# Patient Record
Sex: Female | Born: 1970 | Race: White | Hispanic: No | Marital: Married | Smoking: Former smoker
Health system: Southern US, Community
[De-identification: ages and names within clinical notes are randomized; demographics above are authoritative.]

## PROBLEM LIST (undated history)

## (undated) DIAGNOSIS — F419 Anxiety disorder, unspecified: Secondary | ICD-10-CM

## (undated) DIAGNOSIS — N809 Endometriosis, unspecified: Secondary | ICD-10-CM

## (undated) DIAGNOSIS — F32A Depression, unspecified: Secondary | ICD-10-CM

## (undated) HISTORY — DX: Endometriosis, unspecified: N80.9

## (undated) HISTORY — DX: Anxiety disorder, unspecified: F41.9

## (undated) HISTORY — DX: Depression, unspecified: F32.A

---

## 1997-11-21 ENCOUNTER — Encounter: Admission: RE | Admit: 1997-11-21 | Discharge: 1997-11-21 | Payer: Self-pay | Admitting: Family Medicine

## 1998-03-09 ENCOUNTER — Encounter: Admission: RE | Admit: 1998-03-09 | Discharge: 1998-03-09 | Payer: Self-pay | Admitting: Family Medicine

## 1998-11-29 ENCOUNTER — Other Ambulatory Visit: Admission: RE | Admit: 1998-11-29 | Discharge: 1998-11-29 | Payer: Self-pay | Admitting: Obstetrics and Gynecology

## 1999-01-11 ENCOUNTER — Encounter: Payer: Self-pay | Admitting: *Deleted

## 1999-01-11 ENCOUNTER — Inpatient Hospital Stay (HOSPITAL_COMMUNITY): Admission: AD | Admit: 1999-01-11 | Discharge: 1999-01-14 | Payer: Self-pay | Admitting: Obstetrics and Gynecology

## 1999-01-13 ENCOUNTER — Encounter: Payer: Self-pay | Admitting: Obstetrics and Gynecology

## 1999-02-14 ENCOUNTER — Ambulatory Visit (HOSPITAL_COMMUNITY): Admission: RE | Admit: 1999-02-14 | Discharge: 1999-02-14 | Payer: Self-pay | Admitting: Obstetrics and Gynecology

## 1999-02-14 ENCOUNTER — Encounter: Payer: Self-pay | Admitting: Obstetrics and Gynecology

## 1999-07-10 HISTORY — PX: LAPAROTOMY: SHX154

## 1999-07-24 ENCOUNTER — Inpatient Hospital Stay (HOSPITAL_COMMUNITY): Admission: RE | Admit: 1999-07-24 | Discharge: 1999-07-26 | Payer: Self-pay | Admitting: Obstetrics and Gynecology

## 1999-08-24 ENCOUNTER — Inpatient Hospital Stay (HOSPITAL_COMMUNITY): Admission: EM | Admit: 1999-08-24 | Discharge: 1999-08-24 | Payer: Self-pay | Admitting: Obstetrics and Gynecology

## 1999-09-25 ENCOUNTER — Encounter (HOSPITAL_COMMUNITY): Admission: RE | Admit: 1999-09-25 | Discharge: 1999-12-24 | Payer: Self-pay | Admitting: Obstetrics and Gynecology

## 1999-12-29 ENCOUNTER — Inpatient Hospital Stay (HOSPITAL_COMMUNITY): Admission: AD | Admit: 1999-12-29 | Discharge: 1999-12-29 | Payer: Self-pay | Admitting: Obstetrics and Gynecology

## 2000-06-25 ENCOUNTER — Encounter: Payer: Self-pay | Admitting: Obstetrics and Gynecology

## 2000-06-25 ENCOUNTER — Ambulatory Visit (HOSPITAL_COMMUNITY): Admission: RE | Admit: 2000-06-25 | Discharge: 2000-06-25 | Payer: Self-pay | Admitting: Obstetrics and Gynecology

## 2003-05-13 ENCOUNTER — Other Ambulatory Visit: Admission: RE | Admit: 2003-05-13 | Discharge: 2003-05-13 | Payer: Self-pay | Admitting: Obstetrics and Gynecology

## 2004-06-12 ENCOUNTER — Other Ambulatory Visit: Admission: RE | Admit: 2004-06-12 | Discharge: 2004-06-12 | Payer: Self-pay | Admitting: Obstetrics and Gynecology

## 2014-10-03 ENCOUNTER — Other Ambulatory Visit: Payer: Self-pay | Admitting: Obstetrics and Gynecology

## 2014-10-03 DIAGNOSIS — N644 Mastodynia: Secondary | ICD-10-CM

## 2014-10-07 ENCOUNTER — Ambulatory Visit
Admission: RE | Admit: 2014-10-07 | Discharge: 2014-10-07 | Disposition: A | Discharge status: Home / Self Care | Payer: 59 | Source: Ambulatory Visit | Attending: Obstetrics and Gynecology | Admitting: Obstetrics and Gynecology

## 2014-10-07 DIAGNOSIS — N644 Mastodynia: Secondary | ICD-10-CM

## 2020-02-18 DIAGNOSIS — F419 Anxiety disorder, unspecified: Secondary | ICD-10-CM | POA: Diagnosis not present

## 2020-03-21 DIAGNOSIS — Z20822 Contact with and (suspected) exposure to covid-19: Secondary | ICD-10-CM | POA: Diagnosis not present

## 2020-03-21 DIAGNOSIS — Z03818 Encounter for observation for suspected exposure to other biological agents ruled out: Secondary | ICD-10-CM | POA: Diagnosis not present

## 2020-04-13 DIAGNOSIS — R87610 Atypical squamous cells of undetermined significance on cytologic smear of cervix (ASC-US): Secondary | ICD-10-CM | POA: Diagnosis not present

## 2020-08-21 DIAGNOSIS — Z0000 Encounter for general adult medical examination without abnormal findings: Secondary | ICD-10-CM | POA: Diagnosis not present

## 2020-08-28 DIAGNOSIS — M545 Low back pain, unspecified: Secondary | ICD-10-CM | POA: Diagnosis not present

## 2020-08-28 DIAGNOSIS — R87610 Atypical squamous cells of undetermined significance on cytologic smear of cervix (ASC-US): Secondary | ICD-10-CM | POA: Diagnosis not present

## 2020-08-28 DIAGNOSIS — Z0000 Encounter for general adult medical examination without abnormal findings: Secondary | ICD-10-CM | POA: Diagnosis not present

## 2020-08-28 DIAGNOSIS — Z23 Encounter for immunization: Secondary | ICD-10-CM | POA: Diagnosis not present

## 2020-08-28 DIAGNOSIS — Z1212 Encounter for screening for malignant neoplasm of rectum: Secondary | ICD-10-CM | POA: Diagnosis not present

## 2020-12-13 DIAGNOSIS — Z6829 Body mass index (BMI) 29.0-29.9, adult: Secondary | ICD-10-CM | POA: Diagnosis not present

## 2020-12-13 DIAGNOSIS — Z1231 Encounter for screening mammogram for malignant neoplasm of breast: Secondary | ICD-10-CM | POA: Diagnosis not present

## 2020-12-13 DIAGNOSIS — N951 Menopausal and female climacteric states: Secondary | ICD-10-CM | POA: Diagnosis not present

## 2020-12-13 DIAGNOSIS — Z01419 Encounter for gynecological examination (general) (routine) without abnormal findings: Secondary | ICD-10-CM | POA: Diagnosis not present

## 2020-12-18 ENCOUNTER — Other Ambulatory Visit: Payer: Self-pay | Admitting: Obstetrics and Gynecology

## 2020-12-18 ENCOUNTER — Encounter: Payer: Self-pay | Admitting: Internal Medicine

## 2020-12-18 DIAGNOSIS — R928 Other abnormal and inconclusive findings on diagnostic imaging of breast: Secondary | ICD-10-CM

## 2020-12-21 ENCOUNTER — Other Ambulatory Visit: Payer: Self-pay

## 2020-12-21 ENCOUNTER — Ambulatory Visit
Admission: RE | Admit: 2020-12-21 | Discharge: 2020-12-21 | Disposition: A | Discharge status: Home / Self Care | Payer: BC Managed Care – PPO | Source: Ambulatory Visit | Attending: Obstetrics and Gynecology | Admitting: Obstetrics and Gynecology

## 2020-12-21 DIAGNOSIS — R928 Other abnormal and inconclusive findings on diagnostic imaging of breast: Secondary | ICD-10-CM

## 2020-12-21 DIAGNOSIS — R922 Inconclusive mammogram: Secondary | ICD-10-CM | POA: Diagnosis not present

## 2020-12-22 ENCOUNTER — Other Ambulatory Visit: Payer: Self-pay

## 2020-12-22 ENCOUNTER — Ambulatory Visit (AMBULATORY_SURGERY_CENTER): Payer: BC Managed Care – PPO

## 2020-12-22 VITALS — Ht 67.0 in | Wt 181.0 lb

## 2020-12-22 DIAGNOSIS — Z1211 Encounter for screening for malignant neoplasm of colon: Secondary | ICD-10-CM

## 2020-12-22 NOTE — Progress Notes (Signed)

## 2020-12-25 ENCOUNTER — Encounter: Payer: Self-pay | Admitting: Internal Medicine

## 2021-01-04 ENCOUNTER — Encounter: Payer: Self-pay | Admitting: Internal Medicine

## 2021-01-04 ENCOUNTER — Other Ambulatory Visit: Payer: Self-pay

## 2021-01-04 ENCOUNTER — Ambulatory Visit (AMBULATORY_SURGERY_CENTER): Payer: BC Managed Care – PPO | Admitting: Internal Medicine

## 2021-01-04 VITALS — BP 110/92 | HR 53 | Temp 97.3°F | Resp 10 | Ht 67.0 in | Wt 181.0 lb

## 2021-01-04 DIAGNOSIS — R195 Other fecal abnormalities: Secondary | ICD-10-CM

## 2021-01-04 DIAGNOSIS — K649 Unspecified hemorrhoids: Secondary | ICD-10-CM

## 2021-01-04 DIAGNOSIS — Z1211 Encounter for screening for malignant neoplasm of colon: Secondary | ICD-10-CM | POA: Diagnosis not present

## 2021-01-04 MED ORDER — SODIUM CHLORIDE 0.9 % IV SOLN: 500.0000 mL | Freq: Once | INTRAVENOUS | Status: DC

## 2021-01-04 NOTE — Progress Notes (Signed)
No problems noted in the recovery room. maw 

## 2021-01-04 NOTE — Progress Notes (Signed)
Report given to PACU, vss 

## 2021-01-04 NOTE — Progress Notes (Signed)
Pt's states no medical or surgical changes since previsit or office visit. Vitals done by DT. 

## 2021-01-04 NOTE — Progress Notes (Signed)
GASTROENTEROLOGY PROCEDURE H&P NOTE   Primary Care Physician: Charlane Ferretti, DO    Reason for Procedure:  Colon cancer screening, positive Hemoccult test  Plan:    Colonoscopy  Patient is appropriate for endoscopic procedure(s) in the ambulatory ([facility]) setting.  The nature of the procedure, as well as the risks, benefits, and alternatives were carefully and thoroughly reviewed with the patient. Ample time for discussion and questions allowed. The patient understood, was satisfied, and agreed to proceed.     HPI: Bailey Cooley is a 50 y.o. female who presents for colonoscopy for colon cancer screening and positive hemoccult in 08/2020. Denies rectal bleeding, melena, diarrhea, constipation, or unintentional weight loss. Denies fam hx of colon cancer. No prior colonoscopy.  Past Medical History:  Diagnosis Date   Anxiety    Depression    Endometriosis     Past Surgical History:  Procedure Laterality Date   LAPAROTOMY  07/1999    Prior to Admission medications   Medication Sig Start Date End Date Taking? Authorizing Provider  escitalopram (LEXAPRO) 10 MG tablet Take 10 mg by mouth daily. 11/07/20  Yes [provider]  norgestimate-ethinyl estradiol (ORTHO-CYCLEN) 0.25-35 MG-MCG tablet Sprintec (28) 0.25 mg-35 mcg tablet  TAKE 1 TABLET BY MOUTH EVERY DAY   Yes [provider]  Diethylpropion HCl CR 75 MG TB24 diethylpropion ER 75 mg tablet,extended release  Take 1 tablet every day by oral route.    [provider]    Current Outpatient Medications  Medication Sig Dispense Refill   escitalopram (LEXAPRO) 10 MG tablet Take 10 mg by mouth daily.     norgestimate-ethinyl estradiol (ORTHO-CYCLEN) 0.25-35 MG-MCG tablet Sprintec (28) 0.25 mg-35 mcg tablet  TAKE 1 TABLET BY MOUTH EVERY DAY     Diethylpropion HCl CR 75 MG TB24 diethylpropion ER 75 mg tablet,extended release  Take 1 tablet every day by oral route.     Current Facility-Administered  Medications  Medication Dose Route Frequency Provider Last Rate Last Admin   0.9 %  sodium chloride infusion  500 mL Intravenous Once Imogene Burn, MD        Allergies as of 01/04/2021   (No Known Allergies)    Family History  Problem Relation Age of Onset   Esophageal cancer Maternal Aunt        unsure of age of onset   Colon cancer Neg Hx    Rectal cancer Neg Hx    Stomach cancer Neg Hx     Social History   Socioeconomic History   Marital status: Married    Spouse name: Not on file   Number of children: Not on file   Years of education: Not on file   Highest education level: Not on file  Occupational History   Not on file  Tobacco Use   Smoking status: Former    Types: Cigarettes    Quit date: 1997    Years since quitting: 25.8   Smokeless tobacco: Never  Vaping Use   Vaping Use: Never used  Substance and Sexual Activity   Alcohol use: Yes    Comment: socially   Drug use: Never   Sexual activity: Yes    Birth control/protection: Pill  Other Topics Concern   Not on file  Social History Narrative   Not on file   Social Determinants of Health   Financial Resource Strain: Not on file  Food Insecurity: Not on file  Transportation Needs: Not on file  Physical Activity: Not on  file  Stress: Not on file  Social Connections: Not on file  Intimate Partner Violence: Not on file    Physical Exam: Vital signs in last 24 hours: BP 122/61   Pulse 87   Temp (!) 97.3 F (36.3 C)   Ht 5\' 7"  (1.702 m)   Wt 181 lb (82.1 kg)   SpO2 99%   BMI 28.35 kg/m  GEN: NAD EYE: Sclerae anicteric ENT: MMM CV: Non-tachycardic Pulm: No increased work of breathing GI: Soft, NT/ND NEURO:  Alert & Oriented   , MD [facility]  01/04/2021 9:39 AM

## 2021-01-04 NOTE — Op Note (Signed)
[facility] Patient Name: Bailey Cooley Procedure Date: 01/04/2021 9:38 AM MRN: [id number] Endoscopist: Nicole Kindred "Bailey Cooley ,  Age: 50 Referring MD:  Date of Birth: Dec 09, 1970 Gender: Female Account #: [id number] Procedure:                Colonoscopy Indications:              Screening for colorectal malignant neoplasm,                            positive FOBT Medicines:                Monitored Anesthesia Care Procedure:                Pre-Anesthesia Assessment:                           - Prior to the procedure, a History and Physical                            was performed, and patient medications and                            allergies were reviewed. The patient's tolerance of                            previous anesthesia was also reviewed. The risks                            and benefits of the procedure and the sedation                            options and risks were discussed with the patient.                            All questions were answered, and informed consent                            was obtained. Prior Anticoagulants: The patient has                            taken no previous anticoagulant or antiplatelet                            agents. ASA Grade Assessment: II - A patient with                            mild systemic disease. After reviewing the risks                            and benefits, the patient was deemed in                            satisfactory condition to undergo the procedure.  After obtaining informed consent, the colonoscope                            was passed under direct vision. Throughout the                            procedure, the patient's blood pressure, pulse, and                            oxygen saturations were monitored continuously. The                            PCF-HQ190L Colonoscope was introduced through the                            anus and advanced to the the terminal ileum.  The                            colonoscopy was performed without difficulty. The                            patient tolerated the procedure well. Scope In: 9:48:38 AM Scope Out: 10:06:17 AM Scope Withdrawal Time: 0 hours 11 minutes 27 seconds  Total Procedure Duration: 0 hours 17 minutes 39 seconds  Findings:                 The terminal ileum appeared normal.                           Non-bleeding internal hemorrhoids were found during                            retroflexion.                           The exam was otherwise without abnormality. Complications:            No immediate complications. Estimated Blood Loss:     Estimated blood loss: none. Impression:               - The examined portion of the ileum was normal.                           - Non-bleeding internal hemorrhoids.                           - The examination was otherwise normal.                           - No specimens collected. Recommendation:           - Discharge patient to home (with escort).                           - It is suspected that your hemorrhoids are the  reason for your positive fecal occult test.                           - The findings and recommendations were discussed                            with the patient.                           - Repeat colonoscopy in 10 years for screening                            purposes. Nicole Kindred "Bailey Cooley,  01/04/2021 10:12:47 AM

## 2021-01-04 NOTE — Patient Instructions (Addendum)
Handout was given to your care partner on hemorrhoids. You may resume your current medications today. Repeat colonoscopy in 10 years for screening purposes. Please call if any questions or concerns.      YOU HAD AN ENDOSCOPIC PROCEDURE TODAY AT THE [facility]:   Refer to the procedure report that was given to you for any specific questions about what was found during the examination.  If the procedure report does not answer your questions, please call your gastroenterologist to clarify.  If you requested that your care partner not be given the details of your procedure findings, then the procedure report has been included in a sealed envelope for you to review at your convenience later.  YOU SHOULD EXPECT: Some feelings of bloating in the abdomen. Passage of more gas than usual.  Walking can help get rid of the air that was put into your GI tract during the procedure and reduce the bloating. If you had a lower endoscopy (such as a colonoscopy or flexible sigmoidoscopy) you may notice spotting of blood in your stool or on the toilet paper. If you underwent a bowel prep for your procedure, you may not have a normal bowel movement for a few days.  Please Note:  You might notice some irritation and congestion in your nose or some drainage.  This is from the oxygen used during your procedure.  There is no need for concern and it should clear up in a day or so.  SYMPTOMS TO REPORT IMMEDIATELY:  Following lower endoscopy (colonoscopy or flexible sigmoidoscopy):  Excessive amounts of blood in the stool  Significant tenderness or worsening of abdominal pains  Swelling of the abdomen that is new, acute  Fever of 100F or higher    For urgent or emergent issues, a gastroenterologist can be reached at any hour by calling [phone]. Do not use MyChart messaging for urgent concerns.    DIET:  We do recommend a small meal at first, but then you may proceed to your regular diet.  Drink  plenty of fluids but you should avoid alcoholic beverages for 24 hours.  ACTIVITY:  You should plan to take it easy for the rest of today and you should NOT DRIVE or use heavy machinery until tomorrow (because of the sedation medicines used during the test).    FOLLOW UP: Our staff will call the number listed on your records 48-72 hours following your procedure to check on you and address any questions or concerns that you may have regarding the information given to you following your procedure. If we do not reach you, we will leave a message.  We will attempt to reach you two times.  During this call, we will ask if you have developed any symptoms of COVID 19. If you develop any symptoms (ie: fever, flu-like symptoms, shortness of breath, cough etc.) before then, please call [phone].  If you test positive for Covid 19 in the 2 weeks post procedure, please call and report this information to [place].    If any biopsies were taken you will be contacted by phone or by letter within the next 1-3 weeks.  Please call us at [phone] if you have not heard about the biopsies in 3 weeks.    SIGNATURES/CONFIDENTIALITY: You and/or your care partner have signed paperwork which will be entered into your electronic medical record.  These signatures attest to the fact that that the information above on your After Visit Summary has been reviewed and  is understood.  Full responsibility of the confidentiality of this discharge information lies with you and/or your care-partner.

## 2021-01-09 ENCOUNTER — Telehealth: Payer: Self-pay

## 2021-01-09 NOTE — Telephone Encounter (Signed)
No answer, left message to call if having any issues or concerns, B.Rajanee Schuelke RN 

## 2021-05-17 DIAGNOSIS — H1045 Other chronic allergic conjunctivitis: Secondary | ICD-10-CM | POA: Diagnosis not present

## 2021-05-17 DIAGNOSIS — H5213 Myopia, bilateral: Secondary | ICD-10-CM | POA: Diagnosis not present

## 2021-08-30 DIAGNOSIS — Z1339 Encounter for screening examination for other mental health and behavioral disorders: Secondary | ICD-10-CM | POA: Diagnosis not present

## 2021-08-30 DIAGNOSIS — Z0000 Encounter for general adult medical examination without abnormal findings: Secondary | ICD-10-CM | POA: Diagnosis not present

## 2021-08-30 DIAGNOSIS — E663 Overweight: Secondary | ICD-10-CM | POA: Diagnosis not present

## 2021-08-30 DIAGNOSIS — Z1331 Encounter for screening for depression: Secondary | ICD-10-CM | POA: Diagnosis not present

## 2021-08-30 DIAGNOSIS — R87610 Atypical squamous cells of undetermined significance on cytologic smear of cervix (ASC-US): Secondary | ICD-10-CM | POA: Diagnosis not present

## 2021-12-26 DIAGNOSIS — Z01419 Encounter for gynecological examination (general) (routine) without abnormal findings: Secondary | ICD-10-CM | POA: Diagnosis not present

## 2021-12-26 DIAGNOSIS — Z6829 Body mass index (BMI) 29.0-29.9, adult: Secondary | ICD-10-CM | POA: Diagnosis not present

## 2021-12-26 DIAGNOSIS — Z124 Encounter for screening for malignant neoplasm of cervix: Secondary | ICD-10-CM | POA: Diagnosis not present

## 2021-12-26 DIAGNOSIS — Z1231 Encounter for screening mammogram for malignant neoplasm of breast: Secondary | ICD-10-CM | POA: Diagnosis not present

## 2022-02-22 IMAGING — MG MM DIGITAL DIAGNOSTIC UNILAT*R* W/ TOMO W/ CAD
4 series · 4 of 12 positions shown · non-contrast
Comparison: Previous exams.
COMPARISON: Previous exams.

Addendum:
CLINICAL DATA: Screening recall for possible right

EXAM:
DIGITAL DIAGNOSTIC UNILATERAL RIGHT MAMMOGRAM WITH TOMOSYNTHESIS AND
CAD; ULTRASOUND RIGHT BREAST LIMITED
TECHNIQUE: Right digital diagnostic mammography and breast tomosynthesis was
performed. The images were evaluated with computer-aided detection.;
Targeted ultrasound examination of the right breast was performed
CLINICAL DATA: Screening recall for possible right breast asymmetry.
The remainder of the report is correct.
*** End of Addendum ***

[R ML synth-2D]
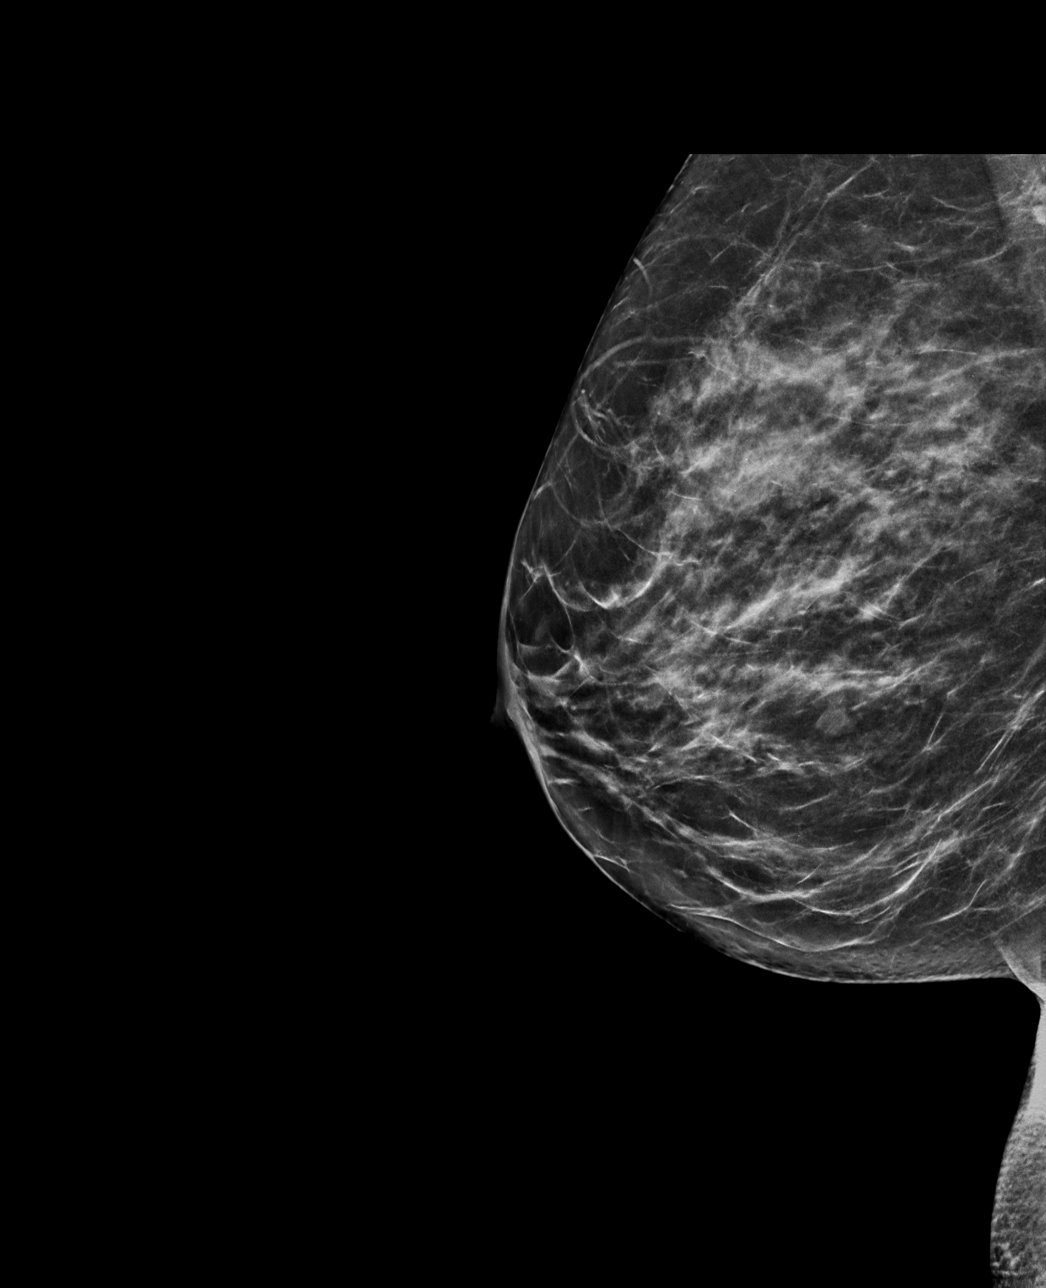

[R MLO synth-2D]
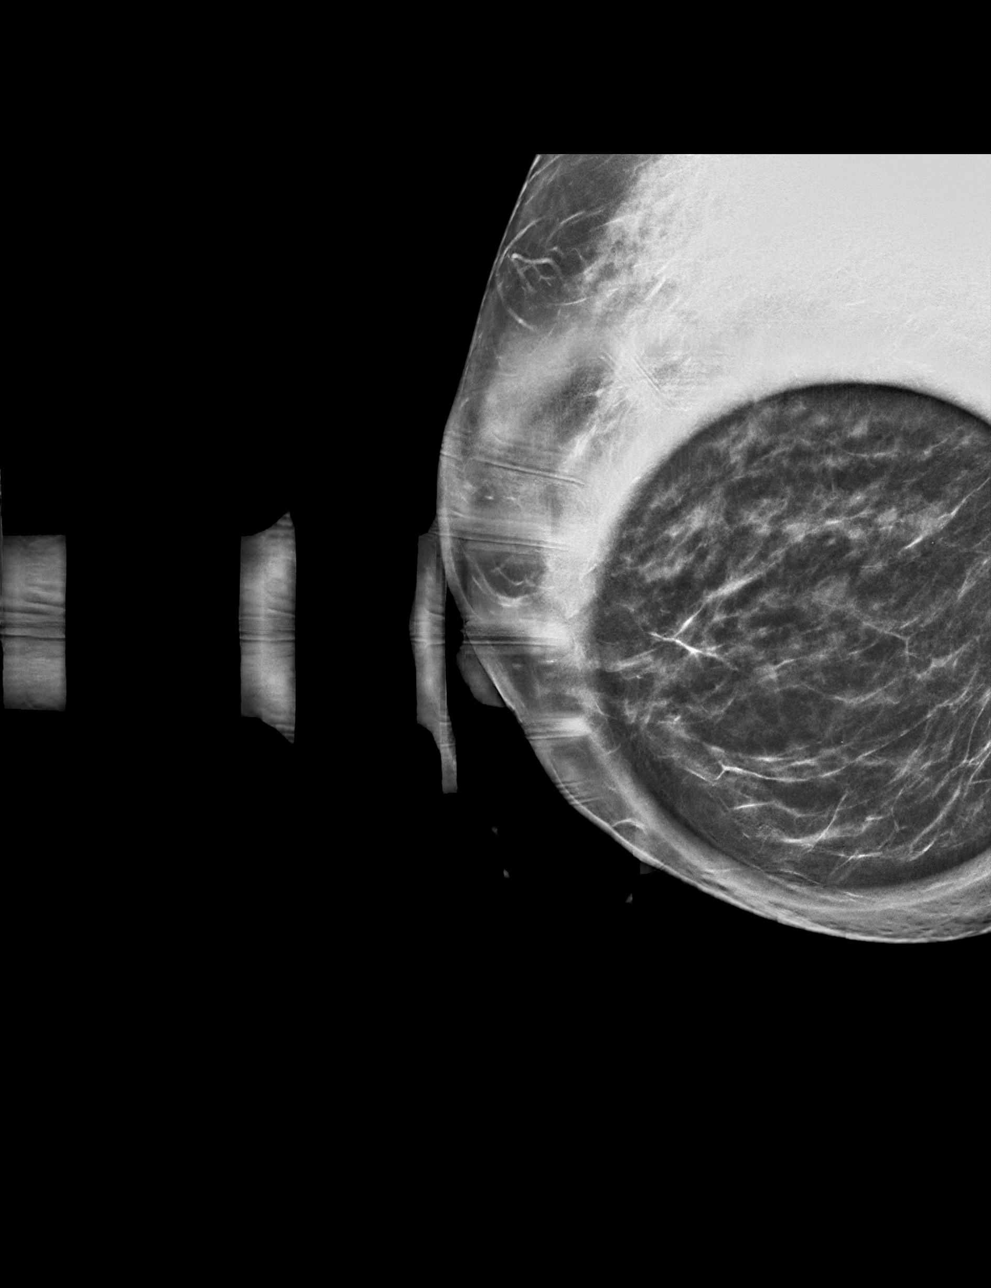

[R ML tomo · tomo slice 35/70.0]
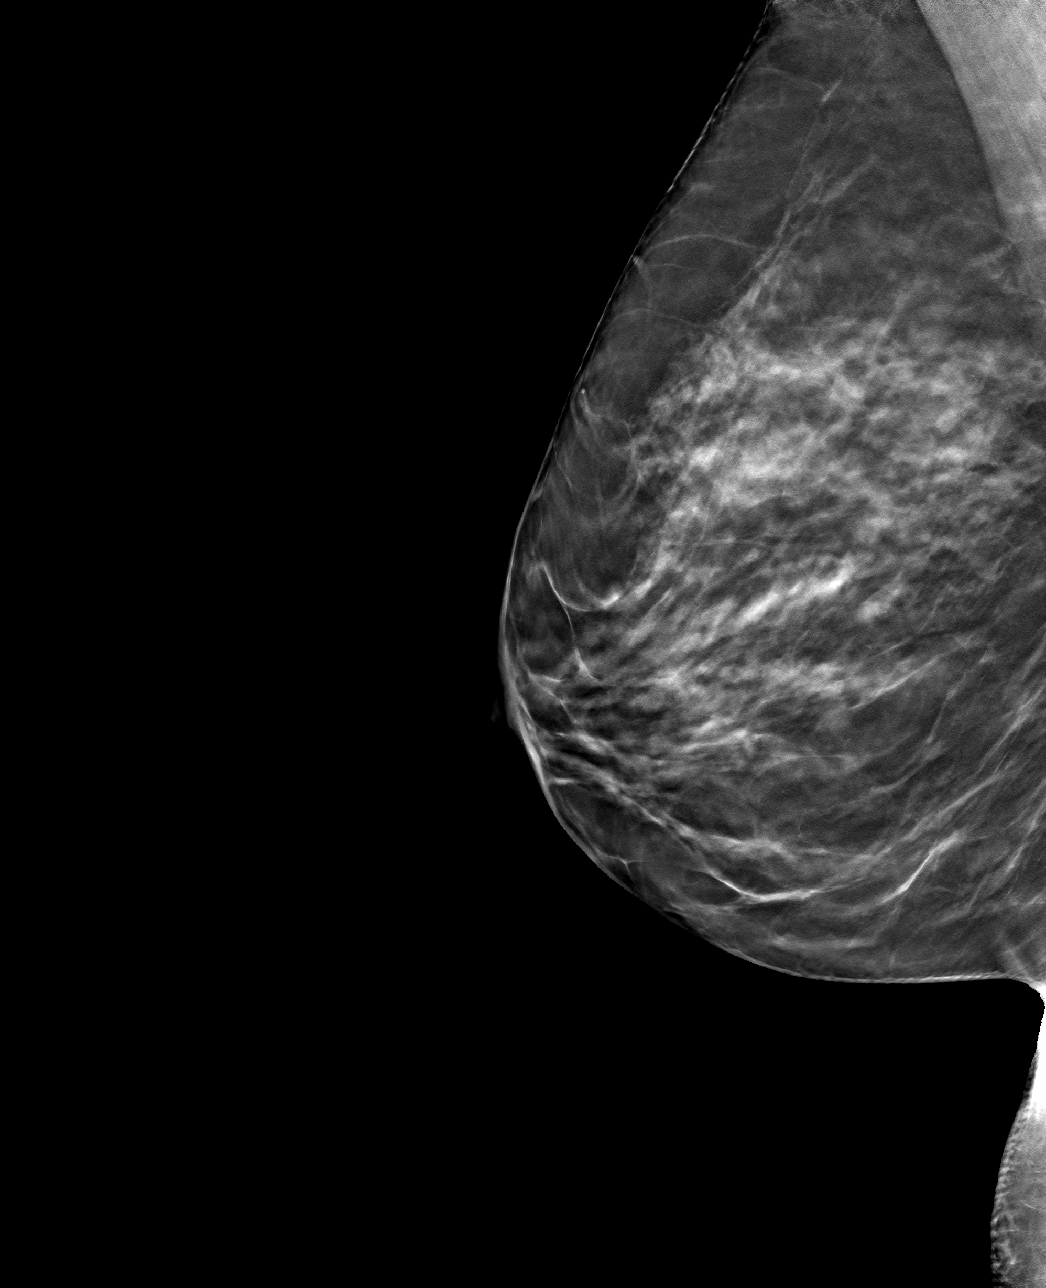

[R MLO tomo · tomo slice 31/60.0]
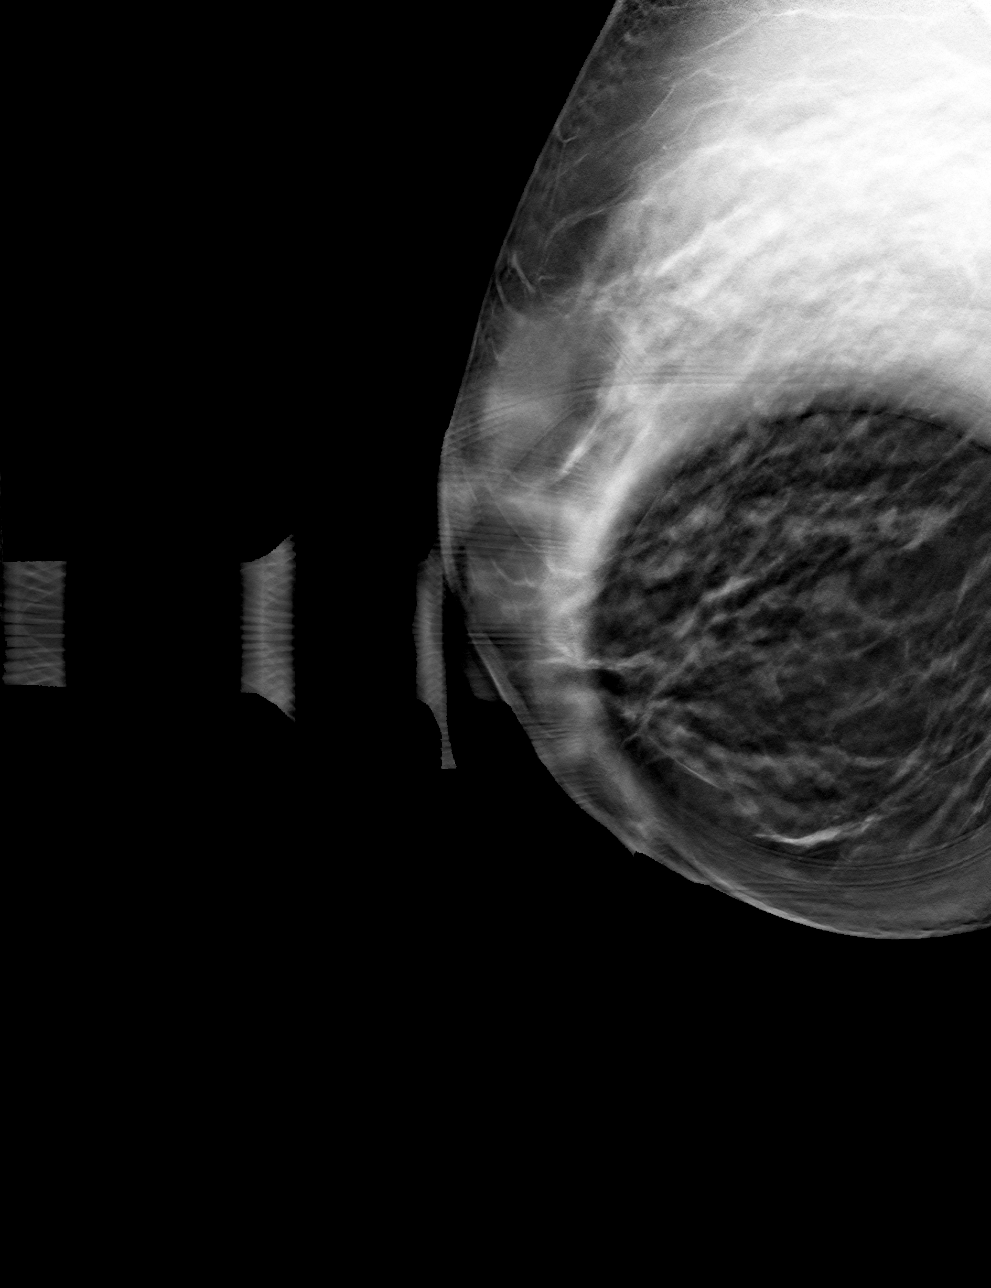

[4 of 12 positions shown; findings below may reference images not displayed]

ACR Breast Density Category c: The breast tissue is heterogeneously
dense, which may obscure small masses.
FINDINGS: Additional tomograms were performed of the right breast. There is an
oval circumscribed mass in the lower slightly outer right breast
measuring 0.7 cm.

Targeted ultrasound of the right breast was performed. There is a
cyst at 8 o'clock 3 cm from nipple measuring 0.6 x 0.5 x 0.6 cm.
This corresponds well with the mass seen in the right breast at
mammography.
IMPRESSION: No findings of malignancy in the right breast.

RECOMMENDATION:
Screening mammogram in one year.(Code:UA-H-QLV)

I have discussed the findings and recommendations with the patient.
If applicable, a reminder letter will be sent to the patient
regarding the next appointment.

BI-RADS CATEGORY  2: Benign.

ADDENDUM:
An addendum is made to the clinical data portion of this exam due to
an omission. This should read as:
ACR Breast Density Category c: The breast tissue is heterogeneously
dense, which may obscure small masses.
FINDINGS: Additional tomograms were performed of the right breast. There is an
oval circumscribed mass in the lower slightly outer right breast
measuring 0.7 cm.

Targeted ultrasound of the right breast was performed. There is a
cyst at 8 o'clock 3 cm from nipple measuring 0.6 x 0.5 x 0.6 cm.
This corresponds well with the mass seen in the right breast at
mammography.
IMPRESSION: No findings of malignancy in the right breast.

RECOMMENDATION:
Screening mammogram in one year.(Code:UA-H-QLV)

I have discussed the findings and recommendations with the patient.
If applicable, a reminder letter will be sent to the patient
regarding the next appointment.

BI-RADS CATEGORY  2: Benign.

## 2022-02-22 IMAGING — US US BREAST*R* LIMITED INC AXILLA
1 series · 5 of 5 positions shown · non-contrast
Comparison: Previous exams.
COMPARISON: Previous exams.

Addendum:
CLINICAL DATA: Screening recall for possible right

EXAM:
DIGITAL DIAGNOSTIC UNILATERAL RIGHT MAMMOGRAM WITH TOMOSYNTHESIS AND
CAD; ULTRASOUND RIGHT BREAST LIMITED
TECHNIQUE: Right digital diagnostic mammography and breast tomosynthesis was
performed. The images were evaluated with computer-aided detection.;
Targeted ultrasound examination of the right breast was performed
CLINICAL DATA: Screening recall for possible right breast asymmetry.
The remainder of the report is correct.
*** End of Addendum ***

[Series 1: us breast*right* limited inc axilla · 0.06mm/px · 5 of 5 slices shown]
[im 1/5]
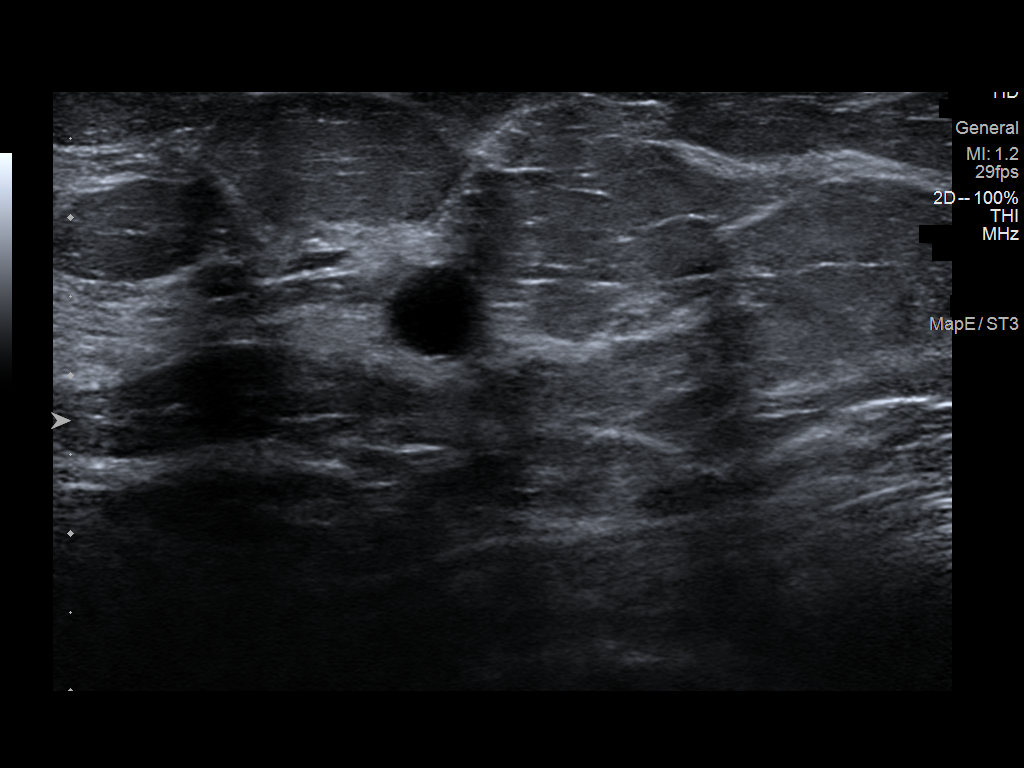
[im 2/5]
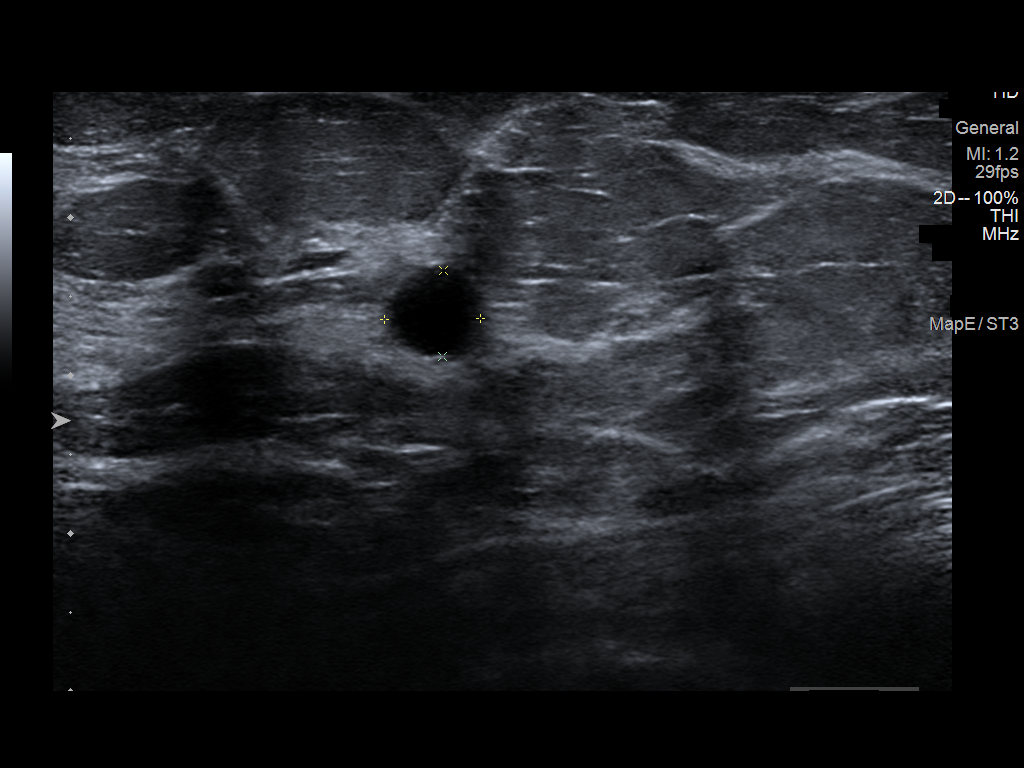
[im 3/5]
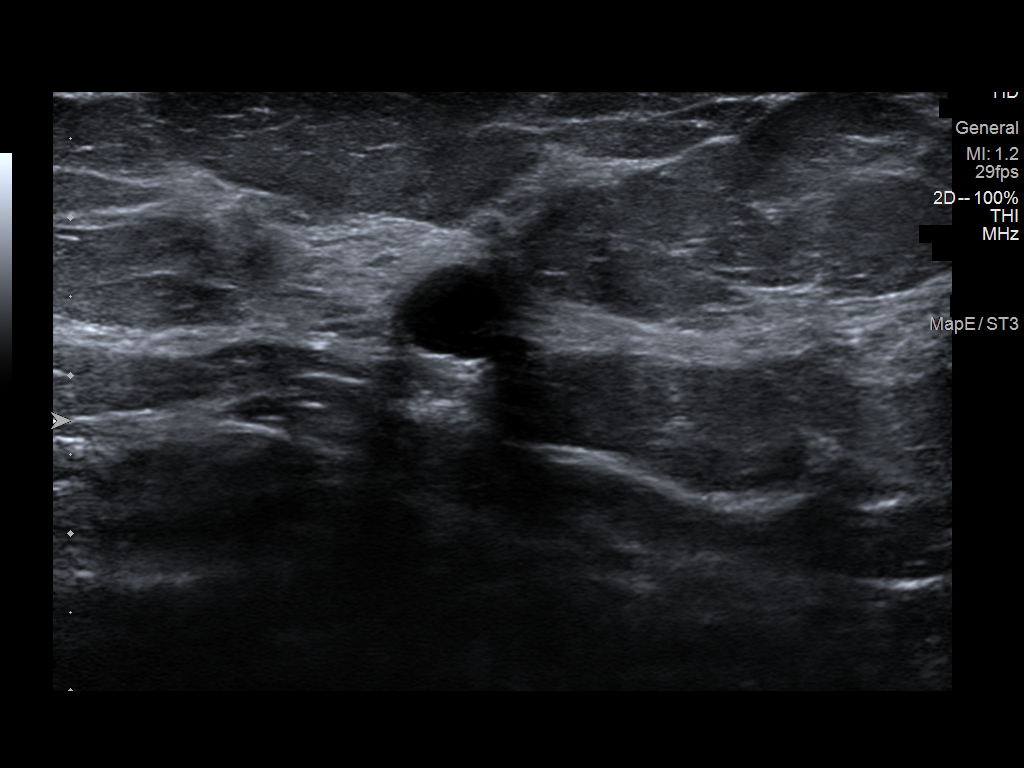
[im 4/5]
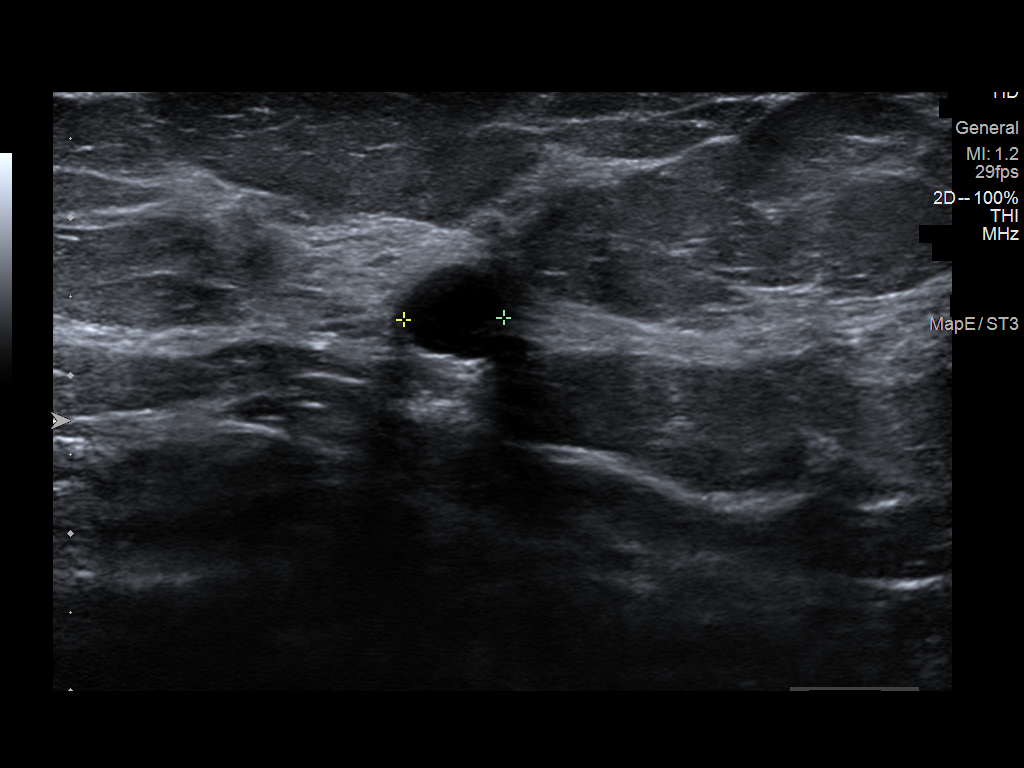
[im 5/5]
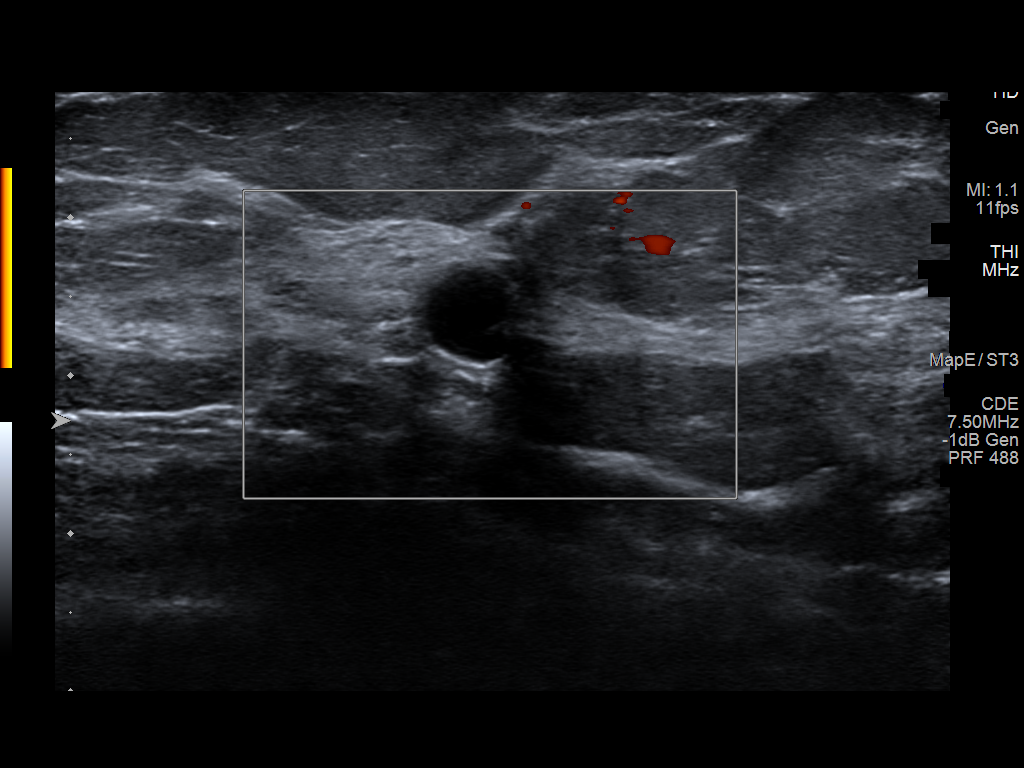

[5 of 5 positions shown; findings below may reference images not displayed]

ACR Breast Density Category c: The breast tissue is heterogeneously
dense, which may obscure small masses.
FINDINGS: Additional tomograms were performed of the right breast. There is an
oval circumscribed mass in the lower slightly outer right breast
measuring 0.7 cm.

Targeted ultrasound of the right breast was performed. There is a
cyst at 8 o'clock 3 cm from nipple measuring 0.6 x 0.5 x 0.6 cm.
This corresponds well with the mass seen in the right breast at
mammography.
IMPRESSION: No findings of malignancy in the right breast.

RECOMMENDATION:
Screening mammogram in one year.(Code:UA-[id number])

I have discussed the findings and recommendations with the patient.
If applicable, a reminder letter will be sent to the patient
regarding the next appointment.

BI-RADS CATEGORY  2: Benign.

ADDENDUM:
An addendum is made to the clinical data portion of this exam due to
an omission. This should read as:
ACR Breast Density Category c: The breast tissue is heterogeneously
dense, which may obscure small masses.
FINDINGS: Additional tomograms were performed of the right breast. There is an
oval circumscribed mass in the lower slightly outer right breast
measuring 0.7 cm.

Targeted ultrasound of the right breast was performed. There is a
cyst at 8 o'clock 3 cm from nipple measuring 0.6 x 0.5 x 0.6 cm.
This corresponds well with the mass seen in the right breast at
mammography.
IMPRESSION: No findings of malignancy in the right breast.

RECOMMENDATION:
Screening mammogram in one year.(Code:UA-[id number])

I have discussed the findings and recommendations with the patient.
If applicable, a reminder letter will be sent to the patient
regarding the next appointment.

BI-RADS CATEGORY  2: Benign.

## 2022-03-05 DIAGNOSIS — N390 Urinary tract infection, site not specified: Secondary | ICD-10-CM | POA: Diagnosis not present

## 2022-05-03 DIAGNOSIS — J101 Influenza due to other identified influenza virus with other respiratory manifestations: Secondary | ICD-10-CM | POA: Diagnosis not present

## 2022-05-03 DIAGNOSIS — R5383 Other fatigue: Secondary | ICD-10-CM | POA: Diagnosis not present

## 2022-05-03 DIAGNOSIS — J069 Acute upper respiratory infection, unspecified: Secondary | ICD-10-CM | POA: Diagnosis not present

## 2022-09-23 DIAGNOSIS — R7989 Other specified abnormal findings of blood chemistry: Secondary | ICD-10-CM | POA: Diagnosis not present

## 2022-09-25 DIAGNOSIS — Z0000 Encounter for general adult medical examination without abnormal findings: Secondary | ICD-10-CM | POA: Diagnosis not present

## 2022-09-25 DIAGNOSIS — Z1339 Encounter for screening examination for other mental health and behavioral disorders: Secondary | ICD-10-CM | POA: Diagnosis not present

## 2022-09-25 DIAGNOSIS — Z1331 Encounter for screening for depression: Secondary | ICD-10-CM | POA: Diagnosis not present

## 2022-09-25 DIAGNOSIS — R82998 Other abnormal findings in urine: Secondary | ICD-10-CM | POA: Diagnosis not present

## 2023-01-21 DIAGNOSIS — Z1231 Encounter for screening mammogram for malignant neoplasm of breast: Secondary | ICD-10-CM | POA: Diagnosis not present

## 2023-01-21 DIAGNOSIS — Z124 Encounter for screening for malignant neoplasm of cervix: Secondary | ICD-10-CM | POA: Diagnosis not present

## 2023-01-21 DIAGNOSIS — Z01419 Encounter for gynecological examination (general) (routine) without abnormal findings: Secondary | ICD-10-CM | POA: Diagnosis not present

## 2023-01-21 DIAGNOSIS — Z780 Asymptomatic menopausal state: Secondary | ICD-10-CM | POA: Diagnosis not present

## 2023-01-21 DIAGNOSIS — Z6829 Body mass index (BMI) 29.0-29.9, adult: Secondary | ICD-10-CM | POA: Diagnosis not present

## 2023-08-21 DIAGNOSIS — Z79899 Other long term (current) drug therapy: Secondary | ICD-10-CM | POA: Diagnosis not present

## 2023-08-21 DIAGNOSIS — R41840 Attention and concentration deficit: Secondary | ICD-10-CM | POA: Diagnosis not present

## 2023-08-27 DIAGNOSIS — Z79899 Other long term (current) drug therapy: Secondary | ICD-10-CM | POA: Diagnosis not present

## 2023-08-27 DIAGNOSIS — R41840 Attention and concentration deficit: Secondary | ICD-10-CM | POA: Diagnosis not present

## 2023-09-24 DIAGNOSIS — R7989 Other specified abnormal findings of blood chemistry: Secondary | ICD-10-CM | POA: Diagnosis not present

## 2023-10-01 DIAGNOSIS — Z79899 Other long term (current) drug therapy: Secondary | ICD-10-CM | POA: Diagnosis not present

## 2023-10-01 DIAGNOSIS — R87610 Atypical squamous cells of undetermined significance on cytologic smear of cervix (ASC-US): Secondary | ICD-10-CM | POA: Diagnosis not present

## 2023-10-01 DIAGNOSIS — Z1331 Encounter for screening for depression: Secondary | ICD-10-CM | POA: Diagnosis not present

## 2023-10-01 DIAGNOSIS — F902 Attention-deficit hyperactivity disorder, combined type: Secondary | ICD-10-CM | POA: Diagnosis not present

## 2023-10-01 DIAGNOSIS — Z1339 Encounter for screening examination for other mental health and behavioral disorders: Secondary | ICD-10-CM | POA: Diagnosis not present

## 2023-10-01 DIAGNOSIS — Z0000 Encounter for general adult medical examination without abnormal findings: Secondary | ICD-10-CM | POA: Diagnosis not present

## 2023-10-08 DIAGNOSIS — F902 Attention-deficit hyperactivity disorder, combined type: Secondary | ICD-10-CM | POA: Diagnosis not present

## 2023-10-08 DIAGNOSIS — Z79899 Other long term (current) drug therapy: Secondary | ICD-10-CM | POA: Diagnosis not present

## 2024-01-06 DIAGNOSIS — Z79899 Other long term (current) drug therapy: Secondary | ICD-10-CM | POA: Diagnosis not present

## 2024-01-06 DIAGNOSIS — F902 Attention-deficit hyperactivity disorder, combined type: Secondary | ICD-10-CM | POA: Diagnosis not present

## 2024-01-23 DIAGNOSIS — Z6828 Body mass index (BMI) 28.0-28.9, adult: Secondary | ICD-10-CM | POA: Diagnosis not present

## 2024-01-23 DIAGNOSIS — Z01419 Encounter for gynecological examination (general) (routine) without abnormal findings: Secondary | ICD-10-CM | POA: Diagnosis not present

## 2024-01-23 DIAGNOSIS — Z124 Encounter for screening for malignant neoplasm of cervix: Secondary | ICD-10-CM | POA: Diagnosis not present

## 2024-01-23 DIAGNOSIS — Z1151 Encounter for screening for human papillomavirus (HPV): Secondary | ICD-10-CM | POA: Diagnosis not present

## 2024-02-02 DIAGNOSIS — Z1231 Encounter for screening mammogram for malignant neoplasm of breast: Secondary | ICD-10-CM | POA: Diagnosis not present

## 2024-02-02 DIAGNOSIS — Z79890 Hormone replacement therapy (postmenopausal): Secondary | ICD-10-CM | POA: Diagnosis not present
